# Patient Record
Sex: Female | Born: 1965 | Race: White | Hispanic: No | Marital: Married | Smoking: Former smoker
Health system: Southern US, Community
[De-identification: ages and names within clinical notes are randomized; demographics above are authoritative.]

## PROBLEM LIST (undated history)

## (undated) DIAGNOSIS — M7521 Bicipital tendinitis, right shoulder: Secondary | ICD-10-CM

## (undated) DIAGNOSIS — N200 Calculus of kidney: Secondary | ICD-10-CM

## (undated) DIAGNOSIS — Z87442 Personal history of urinary calculi: Secondary | ICD-10-CM

## (undated) DIAGNOSIS — Z8489 Family history of other specified conditions: Secondary | ICD-10-CM

## (undated) DIAGNOSIS — E785 Hyperlipidemia, unspecified: Secondary | ICD-10-CM

## (undated) DIAGNOSIS — S43431A Superior glenoid labrum lesion of right shoulder, initial encounter: Secondary | ICD-10-CM

## (undated) DIAGNOSIS — D649 Anemia, unspecified: Secondary | ICD-10-CM

## (undated) HISTORY — PX: DILATION AND CURETTAGE OF UTERUS: SHX78

## (undated) HISTORY — PX: TUBAL LIGATION: SHX77

## (undated) HISTORY — PX: BREAST CYST ASPIRATION: SHX578

## (undated) HISTORY — PX: BREAST BIOPSY: SHX20

## (undated) HISTORY — PX: ABLATION: SHX5711

---

## 2004-05-04 ENCOUNTER — Emergency Department: Payer: Self-pay | Admitting: Emergency Medicine

## 2004-10-13 ENCOUNTER — Ambulatory Visit: Payer: Self-pay | Admitting: Unknown Physician Specialty

## 2005-07-17 ENCOUNTER — Emergency Department: Payer: Self-pay | Admitting: General Practice

## 2005-11-08 ENCOUNTER — Ambulatory Visit: Payer: Self-pay | Admitting: Unknown Physician Specialty

## 2006-10-24 ENCOUNTER — Ambulatory Visit: Payer: Self-pay | Admitting: Podiatry

## 2006-11-22 ENCOUNTER — Ambulatory Visit: Payer: Self-pay | Admitting: Unknown Physician Specialty

## 2007-11-28 ENCOUNTER — Ambulatory Visit: Payer: Self-pay | Admitting: Unknown Physician Specialty

## 2008-12-02 ENCOUNTER — Ambulatory Visit: Payer: Self-pay | Admitting: Unknown Physician Specialty

## 2009-12-07 ENCOUNTER — Ambulatory Visit: Payer: Self-pay | Admitting: Unknown Physician Specialty

## 2010-11-18 ENCOUNTER — Emergency Department: Payer: Self-pay | Admitting: Emergency Medicine

## 2011-01-04 ENCOUNTER — Ambulatory Visit: Payer: Self-pay | Admitting: Unknown Physician Specialty

## 2011-08-01 ENCOUNTER — Ambulatory Visit: Payer: Self-pay | Admitting: Obstetrics and Gynecology

## 2011-08-01 LAB — URINALYSIS, COMPLETE
Bacteria: NONE SEEN
Bilirubin,UR: NEGATIVE
Blood: NEGATIVE
Specific Gravity: 1.018 (ref 1.003–1.030)
Squamous Epithelial: 2

## 2011-08-01 LAB — HEMOGLOBIN: HGB: 14.3 g/dL (ref 12.0–16.0)

## 2011-08-07 ENCOUNTER — Ambulatory Visit: Payer: Self-pay | Admitting: Obstetrics and Gynecology

## 2011-08-07 LAB — PREGNANCY, URINE: Pregnancy Test, Urine: NEGATIVE m[IU]/mL

## 2013-07-29 ENCOUNTER — Ambulatory Visit: Payer: Self-pay | Admitting: Physician Assistant

## 2014-05-27 ENCOUNTER — Emergency Department
Admit: 2014-05-27 | Disposition: A | Discharge status: Home / Self Care | Payer: Self-pay | Admitting: Emergency Medicine

## 2014-05-31 NOTE — Op Note (Signed)
PATIENT NAME:  Kaitlin Mullen, Kaitlin Mullen MR#:  [id number] DATE OF BIRTH:  Mar 27, 1965  DATE OF PROCEDURE:  08/07/2011  PREOPERATIVE DIAGNOSES:  1. Polymenorrhea. 2. Menorrhagia.   POSTOPERATIVE DIAGNOSES: 1. Polymenorrhea. 2. Menorrhagia.   PROCEDURES:  1. Hysteroscopy.  2. Endometrial biopsy. 3. Thermal endometrial ablation.   SURGEON: Ricky Mullen. Logan BoresEvans, MD   ANESTHESIA: LMA.   FINDINGS: Grossly normal cavity, minimal curettings. NovaSure measurements showed a cavity length of 4.5 cm, cavity width of 3.3 cm, power was 82. Time was 1 minute and 36 seconds.   DRAINS: None.   ESTIMATED BLOOD LOSS: Minimal.   PROCEDURE IN DETAIL: The patient consented. Preoperative antibiotics given. She was taken to the operating room and placed in the supine position where LMA anesthesia was initiated. She was then placed in the dorsal lithotomy position using candy-cane stirrups, prepped and draped in the usual sterile fashion. Cervix was visualized. Cervix was noted to have an obstetrical laceration, healed, at about 10:30. Cervix was grasped, easily dilated up to permit hysteroscope with hysteroscopy as noted above.   Hysteroscope was removed. NovaSure apparatus was placed, passed cavity test first attempt and cycle was carried out without incident.   Instruments removed. Cervix was made hemostatic with silver nitrate and she was returned to the supine position under the care of anesthesia.   Please note that brief endometrial curetting was used after hysteroscopy for endometrial sampling and sent for permanent specimen.   All instrument, needle, and sponge counts were correct. The patient tolerated the procedure well. I anticipate a routine postoperative course.   ____________________________ Reatha Harpsicky Mullen. Logan BoresEvans, MD rle:drc D: 08/07/2011 09:50:22 ET T: 08/07/2011 10:26:12 ET JOB#: [id number]  cc: Clide Clifficky Mullen. Logan BoresEvans, MD, <Dictator> Augustina MoodICK Mullen Freda Jaquith MD ELECTRONICALLY SIGNED 08/08/2011 14:36

## 2015-07-15 ENCOUNTER — Other Ambulatory Visit: Payer: Self-pay | Admitting: Physician Assistant

## 2015-07-15 DIAGNOSIS — Z1231 Encounter for screening mammogram for malignant neoplasm of breast: Secondary | ICD-10-CM

## 2015-07-28 ENCOUNTER — Other Ambulatory Visit: Payer: Self-pay | Admitting: Physician Assistant

## 2015-07-28 ENCOUNTER — Ambulatory Visit
Admission: RE | Admit: 2015-07-28 | Discharge: 2015-07-28 | Disposition: A | Discharge status: Home / Self Care | Payer: BLUE CROSS/BLUE SHIELD | Source: Ambulatory Visit | Attending: Physician Assistant | Admitting: Physician Assistant

## 2015-07-28 DIAGNOSIS — Z1231 Encounter for screening mammogram for malignant neoplasm of breast: Secondary | ICD-10-CM

## 2015-07-28 DIAGNOSIS — R928 Other abnormal and inconclusive findings on diagnostic imaging of breast: Secondary | ICD-10-CM | POA: Insufficient documentation

## 2015-07-30 ENCOUNTER — Other Ambulatory Visit: Payer: Self-pay | Admitting: Physician Assistant

## 2015-07-30 DIAGNOSIS — R928 Other abnormal and inconclusive findings on diagnostic imaging of breast: Secondary | ICD-10-CM

## 2015-08-12 ENCOUNTER — Ambulatory Visit
Admission: RE | Admit: 2015-08-12 | Discharge: 2015-08-12 | Disposition: A | Discharge status: Home / Self Care | Payer: BLUE CROSS/BLUE SHIELD | Source: Ambulatory Visit | Attending: Physician Assistant | Admitting: Physician Assistant

## 2015-08-12 DIAGNOSIS — R928 Other abnormal and inconclusive findings on diagnostic imaging of breast: Secondary | ICD-10-CM

## 2016-08-30 ENCOUNTER — Other Ambulatory Visit: Payer: Self-pay | Admitting: Physician Assistant

## 2016-08-30 DIAGNOSIS — Z1231 Encounter for screening mammogram for malignant neoplasm of breast: Secondary | ICD-10-CM

## 2017-02-06 IMAGING — MG 2D DIGITAL DIAGNOSTIC UNILATERAL LEFT MAMMOGRAM WITH CAD AND ADJ
6 series · 6 of 14 positions shown · non-contrast
Comparison: 07/28/2015 and earlier priors

CLINICAL DATA: Possible mass in the retroareolar left breast and
possible skin thickening of the periareolar left breast identified
on recent screening mammogram.

EXAM:
2D DIGITAL DIAGNOSTIC LEFT MAMMOGRAM WITH CAD AND ADJUNCT TOMO
ULTRASOUND LEFT BREAST

[L ML synth-2D]
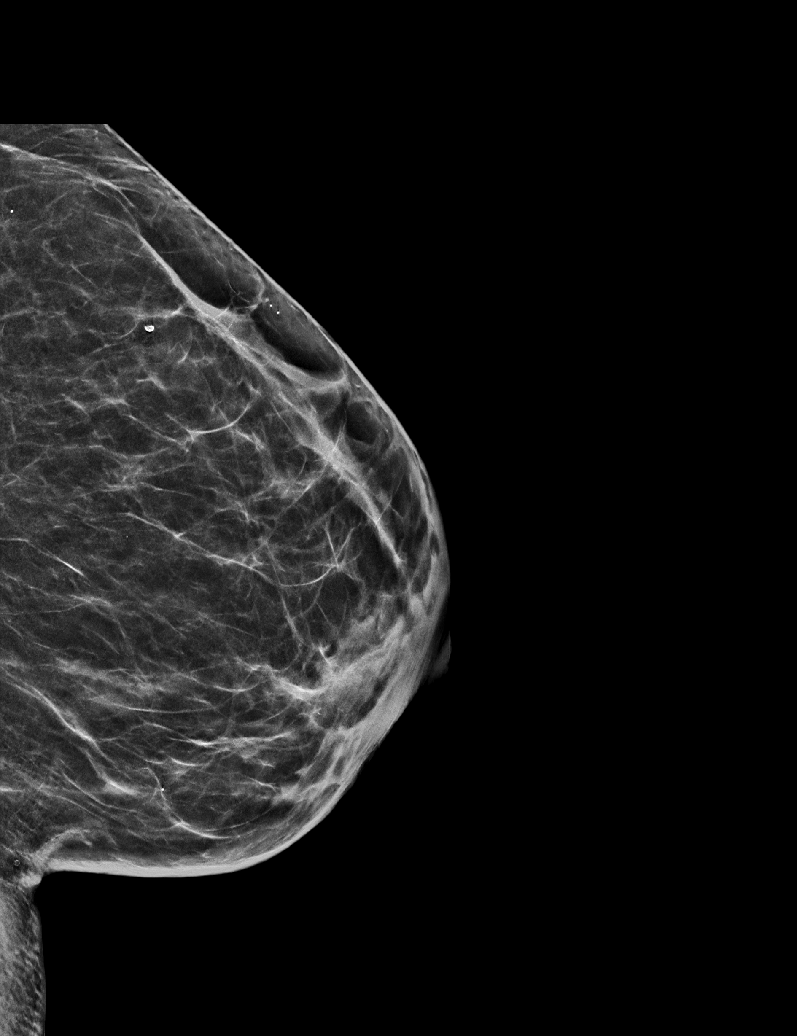

[L MLO]
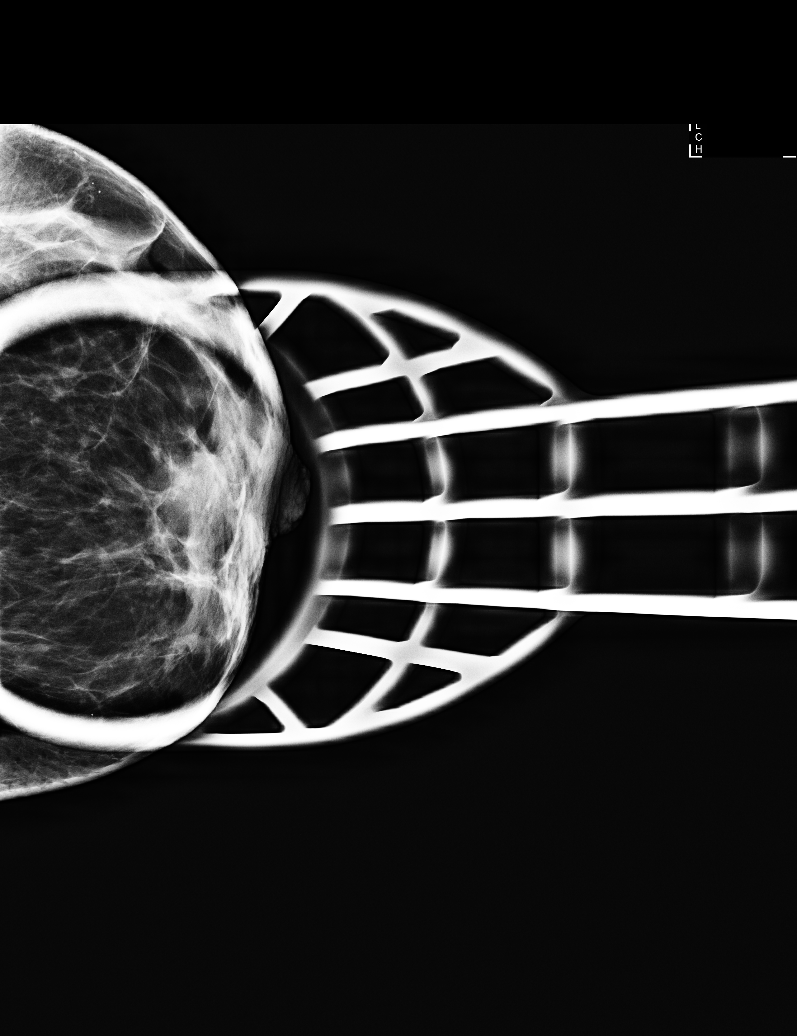

[L MLO synth-2D]
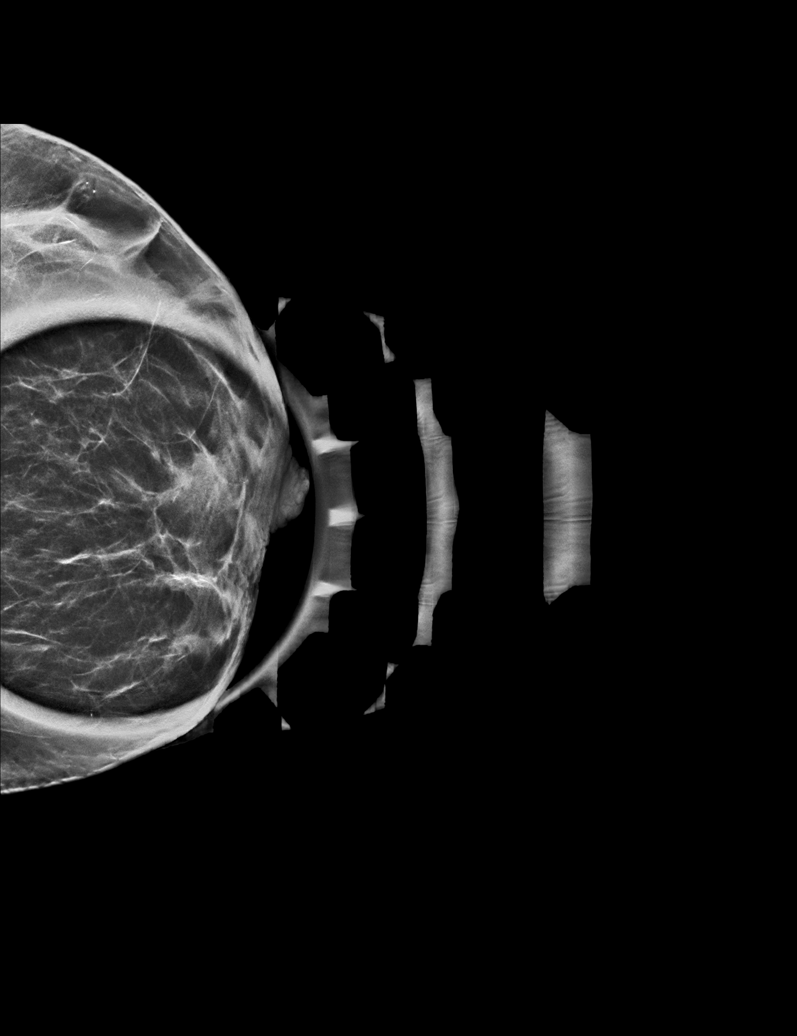

[L ML]
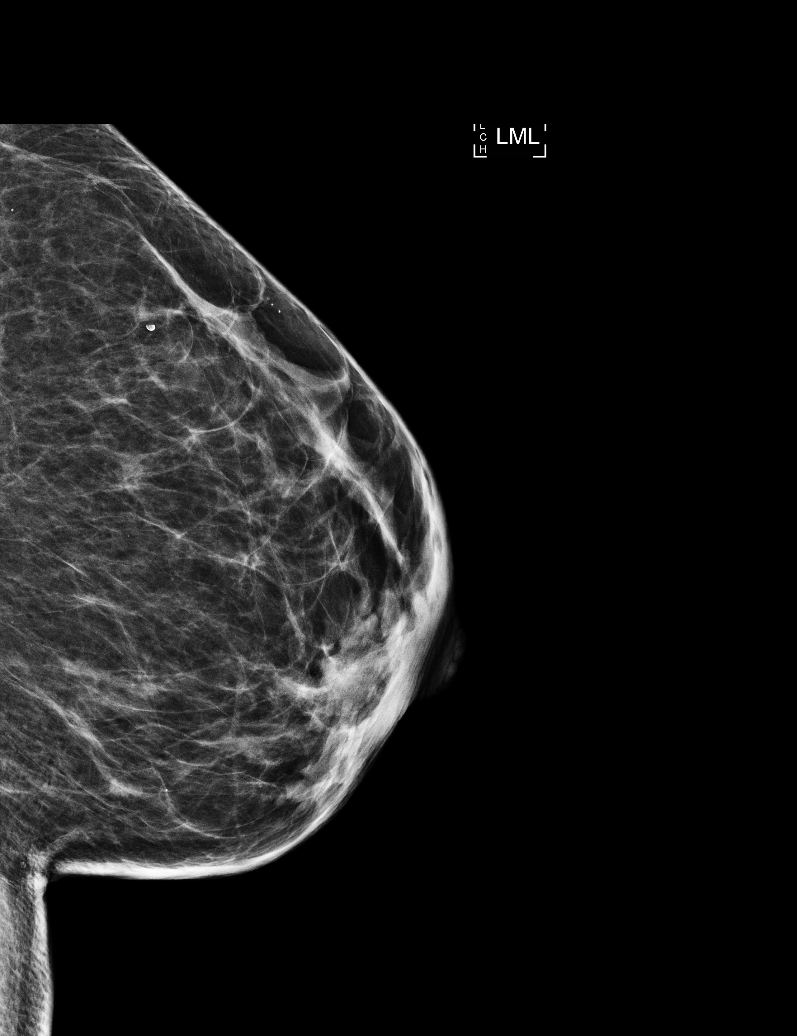

[L ML tomo · tomo slice 27/54.0]
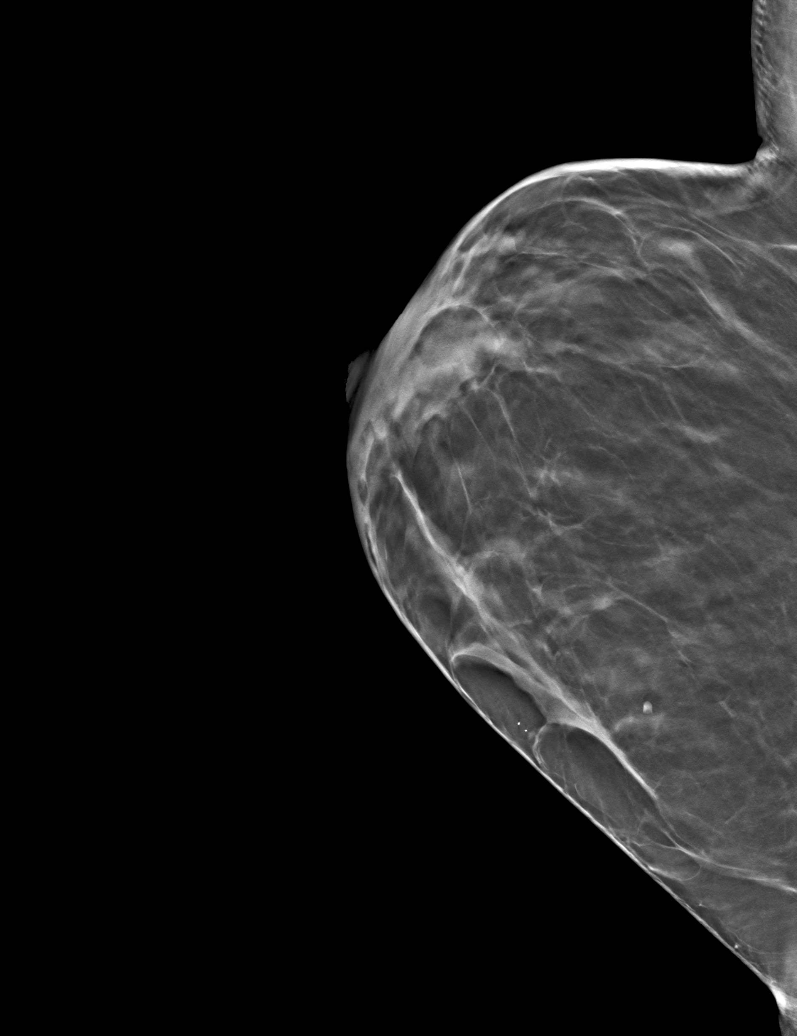

[L MLO tomo · tomo slice 23/46.0]
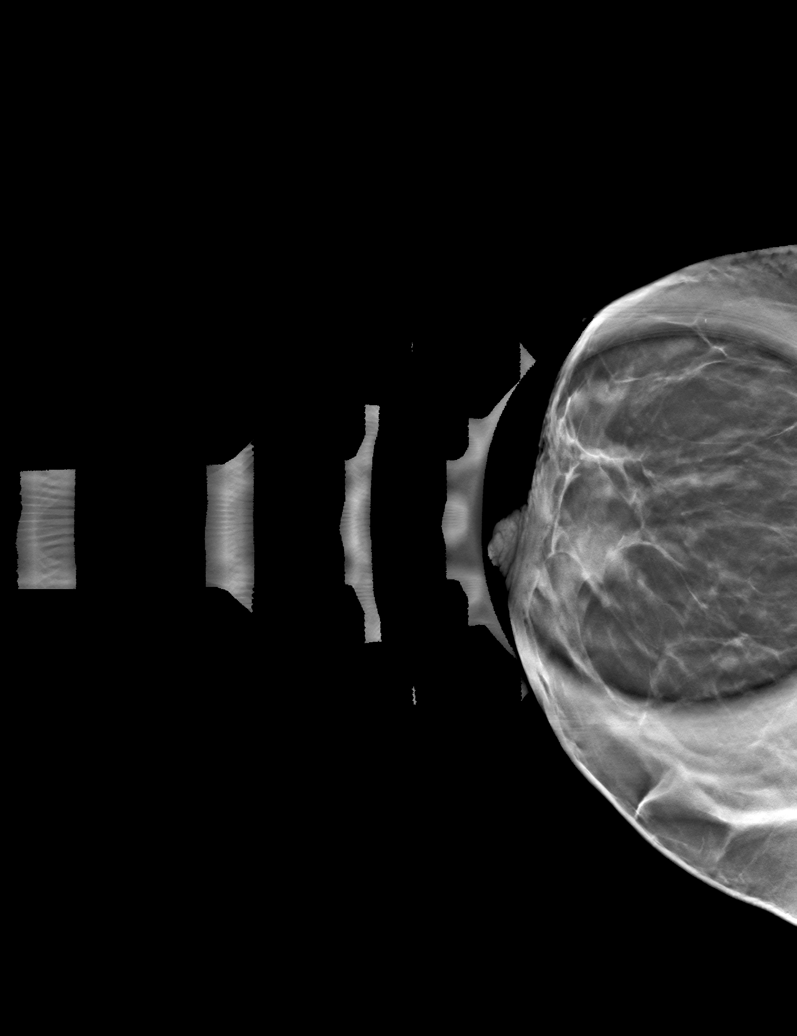

[6 of 14 positions shown; findings below may reference images not displayed]

ACR Breast Density Category c: The breast tissue is heterogeneously
dense, which may obscure small masses.
FINDINGS: Focal spot compression views of the left breast in the MLO
projection, with tomography and 90 degrees lateral images of the
left breast with tomography are submitted. The retroareolar breast
parenchymal pattern appears stable compared to prior mammograms. No
evidence of new definite skin thickening.

Mammographic images were processed with CAD.

On physical exam, the skin of the left breast appears normal. No
retroareolar mass is palpated.

Targeted ultrasound is performed, showing normal skin thickness and
normal retroareolar fibroglandular tissue. No solid or cystic breast
mass or abnormal shadowing is identified.
IMPRESSION: No evidence of malignancy in the left breast.

RECOMMENDATION:
Screening mammogram in one year.(Code:[id number])

I have discussed the findings and recommendations with the patient.
Results were also provided in writing at the conclusion of the
visit. If applicable, a reminder letter will be sent to the patient
regarding the next appointment.

BI-RADS CATEGORY  1: Negative.

## 2017-08-06 ENCOUNTER — Other Ambulatory Visit: Payer: Self-pay | Admitting: Physician Assistant

## 2017-08-06 DIAGNOSIS — R102 Pelvic and perineal pain: Secondary | ICD-10-CM

## 2017-08-08 ENCOUNTER — Ambulatory Visit
Admission: RE | Admit: 2017-08-08 | Discharge: 2017-08-08 | Disposition: A | Discharge status: Home / Self Care | Payer: BLUE CROSS/BLUE SHIELD | Source: Ambulatory Visit | Attending: Physician Assistant | Admitting: Physician Assistant

## 2017-08-08 ENCOUNTER — Encounter (INDEPENDENT_AMBULATORY_CARE_PROVIDER_SITE_OTHER): Payer: Self-pay

## 2017-08-08 DIAGNOSIS — R102 Pelvic and perineal pain: Secondary | ICD-10-CM | POA: Diagnosis not present

## 2017-08-08 DIAGNOSIS — N200 Calculus of kidney: Secondary | ICD-10-CM | POA: Insufficient documentation

## 2017-08-08 MED ORDER — IOPAMIDOL (ISOVUE-300) INJECTION 61%
100.0000 mL | Freq: Once | INTRAVENOUS | Status: AC | PRN
  Administered 2017-08-08: 100 mL via INTRAVENOUS

## 2017-11-08 ENCOUNTER — Other Ambulatory Visit: Payer: Self-pay | Admitting: Physician Assistant

## 2017-11-08 DIAGNOSIS — Z1231 Encounter for screening mammogram for malignant neoplasm of breast: Secondary | ICD-10-CM

## 2017-11-28 ENCOUNTER — Ambulatory Visit
Admission: RE | Admit: 2017-11-28 | Discharge: 2017-11-28 | Disposition: A | Discharge status: Home / Self Care | Payer: BLUE CROSS/BLUE SHIELD | Source: Ambulatory Visit | Attending: Physician Assistant | Admitting: Physician Assistant

## 2017-11-28 DIAGNOSIS — Z1231 Encounter for screening mammogram for malignant neoplasm of breast: Secondary | ICD-10-CM | POA: Diagnosis present

## 2018-10-03 ENCOUNTER — Other Ambulatory Visit: Payer: Self-pay | Admitting: Physician Assistant

## 2018-10-03 DIAGNOSIS — Z1231 Encounter for screening mammogram for malignant neoplasm of breast: Secondary | ICD-10-CM

## 2018-12-17 ENCOUNTER — Ambulatory Visit
Admission: RE | Admit: 2018-12-17 | Discharge: 2018-12-17 | Disposition: A | Discharge status: Home / Self Care | Payer: BC Managed Care – PPO | Source: Ambulatory Visit | Attending: Physician Assistant | Admitting: Physician Assistant

## 2018-12-17 DIAGNOSIS — Z1231 Encounter for screening mammogram for malignant neoplasm of breast: Secondary | ICD-10-CM | POA: Diagnosis not present

## 2019-08-07 ENCOUNTER — Other Ambulatory Visit: Payer: Self-pay | Admitting: Surgery

## 2019-08-07 DIAGNOSIS — M7580 Other shoulder lesions, unspecified shoulder: Secondary | ICD-10-CM

## 2019-08-07 DIAGNOSIS — M7581 Other shoulder lesions, right shoulder: Secondary | ICD-10-CM

## 2019-09-11 ENCOUNTER — Other Ambulatory Visit: Payer: Self-pay | Admitting: Surgery

## 2019-09-16 ENCOUNTER — Other Ambulatory Visit: Payer: Self-pay

## 2019-09-16 ENCOUNTER — Encounter
Admission: RE | Admit: 2019-09-16 | Discharge: 2019-09-16 | Disposition: A | Discharge status: Home / Self Care | Payer: BC Managed Care – PPO | Source: Ambulatory Visit | Attending: Surgery | Admitting: Surgery

## 2019-09-16 HISTORY — DX: Family history of other specified conditions: Z84.89

## 2019-09-16 NOTE — Patient Instructions (Addendum)
Your procedure is scheduled on: 09/23/19 Report to DAY SURGERY DEPARTMENT LOCATED ON 2ND FLOOR ME/DICAL MALL ENTRANCE. /To find out your arrival time please call [phone] between 1PM - 3PM on 09/22/19.  Remember: Instructions that are not followed completely may result in serious medical risk, up to and including death, or upon the discretion of your surgeon and anesthesiologist your surgery may need to be rescheduled.     _X__ 1. Do not eat food after midnight the night before your procedure.                 No gum chewing or hard candies. You may drink clear liquids up to 2 hours                 before you are scheduled to arrive for your surgery- DO not drink clear                 liquids within 2 hours of the start of your surgery.                 Clear Liquids include:  water, apple juice without pulp, clear carbohydrate                 drink such as Clearfast or Gatorade, Black Coffee or Tea (Do not add                 anything to coffee or tea). Diabetics water only  FINISH ENSURE PRE SURGERY (CLEAR) CARB DRINK 2 HOURS PRIOR TO ARRIVING.  __X__2.  On the morning of surgery brush your teeth with toothpaste and water, you                 may rinse your mouth with mouthwash if you wish.  Do not swallow any              toothpaste of mouthwash.     _X__ 3.  No Alcohol for 24 hours before or after surgery.   _X__ 4.  Do Not Smoke or use e-cigarettes For 24 Hours Prior to Your Surgery.                 Do not use any chewable tobacco products for at least 6 hours prior to                 surgery.  ____  5.  Bring all medications with you on the day of surgery if instructed.   __X__  6.  Notify your doctor if there is any change in your medical condition      (cold, fever, infections).     Do not wear jewelry, make-up, hairpins, clips or nail polish. Do not wear lotions, powders, or perfumes.  Do not shave 48 hours prior to surgery. Men may shave face and neck. Do not bring  valuables to the hospital.    [facility] is not responsible for any belongings or valuables.  Contacts, dentures/partials or body piercings may not be worn into surgery. Bring a case for your contacts, glasses or hearing aids, a denture cup will be supplied. Leave your suitcase in the car. After surgery it may be brought to your room. For patients admitted to the hospital, discharge time is determined by your treatment team.   Patients discharged the day of surgery will not be allowed to drive home.   Please read over the following fact sheets that you were given:   MRSA Information  __X__ Take  these medicines the morning of surgery with A SIP OF WATER:    1. NONE  2.   3.   4.  5.  6.  ____ Fleet Enema (as directed)   __X__ Use CHG Soap/SAGE wipes as directed  ____ Use inhalers on the day of surgery  ____ Stop metformin/Janumet/Farxiga 2 days prior to surgery    ____ Take 1/2 of usual insulin dose the night before surgery. No insulin the morning          of surgery.   ____ Stop Blood Thinners Coumadin/Plavix/Xarelto/Pleta/Pradaxa/Eliquis/Effient/Aspirin  on   Or contact your Surgeon, Cardiologist or Medical Doctor regarding  ability to stop your blood thinners  __X__ Stop Anti-inflammatories 7 days before surgery such as Advil, Ibuprofen, Motrin,  BC or Goodies Powder, Naprosyn, Naproxen, Aleve, Aspirin    __X__ Stop all herbal supplements, fish oil or vitamin E until after surgery.    ____ Bring C-Pap to the hospital.     How to Use an Incentive Spirometer An incentive spirometer is a tool that measures how well you are filling your lungs with each breath. Learning to take long, deep breaths using this tool can help you keep your lungs clear and active. This may help to reverse or lessen your chance of developing breathing (pulmonary) problems, especially infection. You may be asked to use a spirometer:  After a surgery.  If you have a lung problem or a history of  smoking.  After a long period of time when you have been unable to move or be active. If the spirometer includes an indicator to show the highest number that you have reached, your health care provider or respiratory therapist will help you set a goal. Keep a list (log) of your progress as told by your health care provider. What are the risks?  Breathing too quickly may cause dizziness or cause you to pass out. Take your time so you do not get dizzy or light-headed.  If you are in pain, you may need to take pain medicine before doing incentive spirometry. It is harder to take a deep breath if you are having pain. How to use your incentive spirometer  1. Sit up on the edge of your bed or on a chair. 2. Hold the incentive spirometer so that it is in an upright position. 3. Before you use the spirometer, breathe out normally. 4. Place the mouthpiece in your mouth. Make sure your lips are closed tightly around it. 5. Breathe in slowly and as deeply as you can through your mouth, causing the piston or the ball to rise toward the top of the chamber. 6. Hold your breath for 3-5 seconds, or for as long as possible. ? If the spirometer includes a coach indicator, use this to guide you in breathing. Slow down your breathing if the indicator goes above the marked areas. 7. Remove the mouthpiece from your mouth and breathe out normally. The piston or ball will return to the bottom of the chamber. 8. Rest for a few seconds, then repeat the steps 10 or more times. ? Take your time and take a few normal breaths between deep breaths so that you do not get dizzy or light-headed. ? Do this every 1-2 hours when you are awake. 9. If the spirometer includes a goal marker to show the highest number you have reached (best effort), use this as a goal to work toward during each repetition. 10. After each set of 10 deep breaths, cough  a few times. This will help to make sure that your lungs are clear. ? If you have an  incision on your chest or abdomen from surgery, place a pillow or a rolled-up towel firmly against the incision when you cough. This can help to reduce pain from coughing. General tips  When you become able to get out of bed, walk around often and continue to cough to help clear your lungs.  Keep using the incentive spirometer until your health care provider says it is okay to stop using it. If you have been in the hospital, you may be told to keep using the spirometer at home. Contact a health care provider if:  You are having difficulty using the spirometer.  You have trouble using the spirometer as often as instructed.  Your pain medicine is not giving enough relief for you to use the spirometer as told.  You have a fever.  You develop shortness of breath. Get help right away if:  You develop a cough with bloody mucus from the lungs (bloody sputum).  You have fluid or blood coming from an incision site after you cough. Summary  An incentive spirometer is a tool that can help you learn to take long, deep breaths to keep your lungs clear and active.  You may be asked to use a spirometer after a surgery, if you have a lung problem or a history of smoking, or if you have been inactive for a long period of time.  Use your incentive spirometer as instructed every 1-2 hours while you are awake.  If you have an incision on your chest or abdomen, place a pillow or a rolled-up towel firmly against your incision when you cough. This will help to reduce pain. This information is not intended to replace advice given to you by your health care provider. Make sure you discuss any questions you have with your health care provider. Document Revised: 08/23/2018 Document Reviewed: 12/06/2016 Elsevier Patient Education  2020 [employer].

## 2019-09-19 ENCOUNTER — Other Ambulatory Visit: Payer: Self-pay

## 2019-09-19 ENCOUNTER — Other Ambulatory Visit
Admission: RE | Admit: 2019-09-19 | Discharge: 2019-09-19 | Disposition: A | Discharge status: Home / Self Care | Payer: BC Managed Care – PPO | Source: Ambulatory Visit | Attending: Surgery | Admitting: Surgery

## 2019-09-19 DIAGNOSIS — Z20822 Contact with and (suspected) exposure to covid-19: Secondary | ICD-10-CM | POA: Diagnosis not present

## 2019-09-19 DIAGNOSIS — Z01812 Encounter for preprocedural laboratory examination: Secondary | ICD-10-CM | POA: Insufficient documentation

## 2019-09-19 LAB — SARS CORONAVIRUS 2 (TAT 6-24 HRS): SARS Coronavirus 2: NEGATIVE

## 2019-09-23 ENCOUNTER — Ambulatory Visit: Payer: BC Managed Care – PPO | Admitting: Certified Registered Nurse Anesthetist

## 2019-09-23 ENCOUNTER — Encounter
Admission: RE | Disposition: A | Discharge status: Home / Self Care | Payer: Self-pay | Source: Home / Self Care | Attending: Surgery

## 2019-09-23 ENCOUNTER — Encounter: Payer: Self-pay | Admitting: Surgery

## 2019-09-23 ENCOUNTER — Other Ambulatory Visit: Payer: Self-pay

## 2019-09-23 ENCOUNTER — Ambulatory Visit
Admission: RE | Admit: 2019-09-23 | Discharge: 2019-09-23 | Disposition: A | Discharge status: Home / Self Care | Payer: BC Managed Care – PPO | Attending: Surgery | Admitting: Surgery

## 2019-09-23 ENCOUNTER — Ambulatory Visit: Payer: BC Managed Care – PPO

## 2019-09-23 DIAGNOSIS — S43431A Superior glenoid labrum lesion of right shoulder, initial encounter: Secondary | ICD-10-CM | POA: Insufficient documentation

## 2019-09-23 DIAGNOSIS — M75111 Incomplete rotator cuff tear or rupture of right shoulder, not specified as traumatic: Secondary | ICD-10-CM | POA: Diagnosis present

## 2019-09-23 DIAGNOSIS — M7521 Bicipital tendinitis, right shoulder: Secondary | ICD-10-CM | POA: Insufficient documentation

## 2019-09-23 DIAGNOSIS — Z87891 Personal history of nicotine dependence: Secondary | ICD-10-CM | POA: Insufficient documentation

## 2019-09-23 DIAGNOSIS — X58XXXA Exposure to other specified factors, initial encounter: Secondary | ICD-10-CM | POA: Insufficient documentation

## 2019-09-23 DIAGNOSIS — E785 Hyperlipidemia, unspecified: Secondary | ICD-10-CM | POA: Insufficient documentation

## 2019-09-23 DIAGNOSIS — Z419 Encounter for procedure for purposes other than remedying health state, unspecified: Secondary | ICD-10-CM

## 2019-09-23 HISTORY — PX: SHOULDER ARTHROSCOPY WITH SUBACROMIAL DECOMPRESSION AND OPEN ROTATOR C: SHX5688

## 2019-09-23 SURGERY — SHOULDER ARTHROSCOPY WITH SUBACROMIAL DECOMPRESSION AND OPEN ROTATOR CUFF REPAIR, OPEN BICEPS TENDON REPAIR
Anesthesia: General | Site: Shoulder | Laterality: Right

## 2019-09-23 MED ORDER — BUPIVACAINE-EPINEPHRINE 0.5% -1:200000 IJ SOLN
INTRAMUSCULAR | Status: DC | PRN
  Administered 2019-09-23: 30 mL

## 2019-09-23 MED ORDER — OXYCODONE HCL 5 MG PO TABS
ORAL_TABLET | ORAL | Status: AC
  Filled 2019-09-23: qty 1

## 2019-09-23 MED ORDER — PHENYLEPHRINE HCL (PRESSORS) 10 MG/ML IV SOLN
INTRAVENOUS | Status: DC | PRN
  Administered 2019-09-23: 100 ug via INTRAVENOUS

## 2019-09-23 MED ORDER — FENTANYL CITRATE (PF) 100 MCG/2ML IJ SOLN
INTRAMUSCULAR | Status: DC | PRN
  Administered 2019-09-23 (×2): 50 ug via INTRAVENOUS

## 2019-09-23 MED ORDER — METOCLOPRAMIDE HCL 5 MG/ML IJ SOLN: 5.0000 mg | Freq: Three times a day (TID) | INTRAMUSCULAR | Status: DC | PRN

## 2019-09-23 MED ORDER — OXYCODONE HCL 5 MG PO TABS: 5.0000 mg | ORAL_TABLET | ORAL | 0 refills | Status: AC | PRN

## 2019-09-23 MED ORDER — ONDANSETRON HCL 4 MG/2ML IJ SOLN: 4.0000 mg | Freq: Four times a day (QID) | INTRAMUSCULAR | Status: DC | PRN

## 2019-09-23 MED ORDER — OXYCODONE HCL 5 MG PO TABS
5.0000 mg | ORAL_TABLET | ORAL | Status: DC | PRN
  Administered 2019-09-23: 5 mg via ORAL
  Filled 2019-09-23 (×2): qty 2

## 2019-09-23 MED ORDER — PHENYLEPHRINE HCL-NACL 10-0.9 MG/250ML-% IV SOLN
INTRAVENOUS | Status: DC | PRN
  Administered 2019-09-23: 25 ug/min via INTRAVENOUS

## 2019-09-23 MED ORDER — FENTANYL CITRATE (PF) 100 MCG/2ML IJ SOLN: 25.0000 ug | Freq: Once | INTRAMUSCULAR | Status: AC

## 2019-09-23 MED ORDER — ACETAMINOPHEN 10 MG/ML IV SOLN
INTRAVENOUS | Status: AC
  Filled 2019-09-23: qty 100

## 2019-09-23 MED ORDER — LIDOCAINE HCL (CARDIAC) PF 100 MG/5ML IV SOSY
PREFILLED_SYRINGE | INTRAVENOUS | Status: DC | PRN
  Administered 2019-09-23: 80 mg via INTRAVENOUS

## 2019-09-23 MED ORDER — BUPIVACAINE LIPOSOME 1.3 % IJ SUSP
INTRAMUSCULAR | Status: DC | PRN
  Administered 2019-09-23: 20 mL via PERINEURAL

## 2019-09-23 MED ORDER — MIDAZOLAM HCL 2 MG/2ML IJ SOLN: 1.0000 mg | Freq: Once | INTRAMUSCULAR | Status: DC

## 2019-09-23 MED ORDER — DEXAMETHASONE SODIUM PHOSPHATE 10 MG/ML IJ SOLN
INTRAMUSCULAR | Status: DC | PRN
  Administered 2019-09-23: 8 mg via INTRAVENOUS

## 2019-09-23 MED ORDER — LACTATED RINGERS IV SOLN: INTRAVENOUS | Status: DC

## 2019-09-23 MED ORDER — ACETAMINOPHEN 10 MG/ML IV SOLN
INTRAVENOUS | Status: DC | PRN
  Administered 2019-09-23: 1000 mg via INTRAVENOUS

## 2019-09-23 MED ORDER — GLYCOPYRROLATE 0.2 MG/ML IJ SOLN
INTRAMUSCULAR | Status: DC | PRN
  Administered 2019-09-23: .2 mg via INTRAVENOUS

## 2019-09-23 MED ORDER — LIDOCAINE HCL (PF) 1 % IJ SOLN
INTRAMUSCULAR | Status: DC | PRN
  Administered 2019-09-23: 4 mL via SUBCUTANEOUS

## 2019-09-23 MED ORDER — MIDAZOLAM HCL 2 MG/2ML IJ SOLN
INTRAMUSCULAR | Status: AC
  Filled 2019-09-23: qty 2

## 2019-09-23 MED ORDER — FENTANYL CITRATE (PF) 100 MCG/2ML IJ SOLN
INTRAMUSCULAR | Status: AC
  Filled 2019-09-23: qty 2

## 2019-09-23 MED ORDER — BUPIVACAINE HCL (PF) 0.5 % IJ SOLN
INTRAMUSCULAR | Status: AC
  Filled 2019-09-23: qty 10

## 2019-09-23 MED ORDER — LIDOCAINE HCL (PF) 1 % IJ SOLN
INTRAMUSCULAR | Status: AC
  Filled 2019-09-23: qty 5

## 2019-09-23 MED ORDER — SUGAMMADEX SODIUM 200 MG/2ML IV SOLN
INTRAVENOUS | Status: DC | PRN
  Administered 2019-09-23: 200 mg via INTRAVENOUS

## 2019-09-23 MED ORDER — MIDAZOLAM HCL 2 MG/2ML IJ SOLN: 2.0000 mg | Freq: Once | INTRAMUSCULAR | Status: AC

## 2019-09-23 MED ORDER — ROCURONIUM BROMIDE 100 MG/10ML IV SOLN
INTRAVENOUS | Status: DC | PRN
  Administered 2019-09-23: 50 mg via INTRAVENOUS

## 2019-09-23 MED ORDER — CEFAZOLIN SODIUM-DEXTROSE 2-4 GM/100ML-% IV SOLN
2.0000 g | INTRAVENOUS | Status: AC
  Administered 2019-09-23: 2 g via INTRAVENOUS

## 2019-09-23 MED ORDER — PROPOFOL 10 MG/ML IV BOLUS
INTRAVENOUS | Status: DC | PRN
  Administered 2019-09-23: 170 mg via INTRAVENOUS

## 2019-09-23 MED ORDER — POTASSIUM CHLORIDE IN NACL 20-0.9 MEQ/L-% IV SOLN
INTRAVENOUS | Status: DC
  Filled 2019-09-23: qty 1000

## 2019-09-23 MED ORDER — FENTANYL CITRATE (PF) 100 MCG/2ML IJ SOLN
25.0000 ug | INTRAMUSCULAR | Status: DC | PRN
  Administered 2019-09-23: 25 ug via INTRAVENOUS

## 2019-09-23 MED ORDER — FAMOTIDINE 20 MG PO TABS
20.0000 mg | ORAL_TABLET | Freq: Once | ORAL | Status: AC
  Administered 2019-09-23: 20 mg via ORAL

## 2019-09-23 MED ORDER — MIDAZOLAM HCL 2 MG/2ML IJ SOLN
INTRAMUSCULAR | Status: AC
  Administered 2019-09-23: 2 mg via INTRAVENOUS
  Filled 2019-09-23: qty 2

## 2019-09-23 MED ORDER — CEFAZOLIN SODIUM-DEXTROSE 2-4 GM/100ML-% IV SOLN
INTRAVENOUS | Status: AC
  Filled 2019-09-23: qty 100

## 2019-09-23 MED ORDER — ONDANSETRON HCL 4 MG/2ML IJ SOLN
INTRAMUSCULAR | Status: AC
  Filled 2019-09-23: qty 2

## 2019-09-23 MED ORDER — FENTANYL CITRATE (PF) 100 MCG/2ML IJ SOLN
INTRAMUSCULAR | Status: AC
  Administered 2019-09-23: 25 ug via INTRAVENOUS
  Filled 2019-09-23: qty 2

## 2019-09-23 MED ORDER — ONDANSETRON HCL 4 MG/2ML IJ SOLN
INTRAMUSCULAR | Status: DC | PRN
  Administered 2019-09-23: 4 mg via INTRAVENOUS

## 2019-09-23 MED ORDER — ONDANSETRON HCL 4 MG PO TABS: 4.0000 mg | ORAL_TABLET | Freq: Four times a day (QID) | ORAL | Status: DC | PRN

## 2019-09-23 MED ORDER — CHLORHEXIDINE GLUCONATE 0.12 % MT SOLN: 15.0000 mL | Freq: Once | OROMUCOSAL | Status: AC

## 2019-09-23 MED ORDER — ORAL CARE MOUTH RINSE: 15.0000 mL | Freq: Once | OROMUCOSAL | Status: AC

## 2019-09-23 MED ORDER — FAMOTIDINE 20 MG PO TABS
ORAL_TABLET | ORAL | Status: AC
  Filled 2019-09-23: qty 1

## 2019-09-23 MED ORDER — ONDANSETRON HCL 4 MG/2ML IJ SOLN: 4.0000 mg | Freq: Once | INTRAMUSCULAR | Status: DC | PRN

## 2019-09-23 MED ORDER — BUPIVACAINE LIPOSOME 1.3 % IJ SUSP
INTRAMUSCULAR | Status: AC
  Filled 2019-09-23: qty 20

## 2019-09-23 MED ORDER — CHLORHEXIDINE GLUCONATE 0.12 % MT SOLN
OROMUCOSAL | Status: AC
  Administered 2019-09-23: 15 mL via OROMUCOSAL
  Filled 2019-09-23: qty 15

## 2019-09-23 MED ORDER — BUPIVACAINE HCL (PF) 0.5 % IJ SOLN
INTRAMUSCULAR | Status: DC | PRN
Start: 2019-09-23 — End: 2019-09-23
  Administered 2019-09-23: 10 mL via PERINEURAL

## 2019-09-23 MED ORDER — METOCLOPRAMIDE HCL 10 MG PO TABS: 5.0000 mg | ORAL_TABLET | Freq: Three times a day (TID) | ORAL | Status: DC | PRN

## 2019-09-23 MED ORDER — MIDAZOLAM HCL 2 MG/2ML IJ SOLN
INTRAMUSCULAR | Status: DC | PRN
  Administered 2019-09-23: 2 mg via INTRAVENOUS

## 2019-09-23 MED ORDER — FENTANYL CITRATE (PF) 100 MCG/2ML IJ SOLN: 50.0000 ug | Freq: Once | INTRAMUSCULAR | Status: DC

## 2019-09-23 SURGICAL SUPPLY — 48 items
ANCH SUT 2 2.9 2 LD TPR NDL (Anchor) ×1 IMPLANT
ANCHOR JUGGERKNOT WTAP NDL 2.9 (Anchor) ×3 IMPLANT
APL PRP STRL LF DISP 70% ISPRP (MISCELLANEOUS) ×1
BIT DRILL JUGRKNT W/NDL BIT2.9 (DRILL) ×2 IMPLANT
BLADE FULL RADIUS 3.5 (BLADE) ×3 IMPLANT
BUR ACROMIONIZER 4.0 (BURR) ×3 IMPLANT
CANNULA SHAVER 8MMX76MM (CANNULA) ×3 IMPLANT
CHLORAPREP W/TINT 26 (MISCELLANEOUS) ×3 IMPLANT
COVER MAYO STAND REUSABLE (DRAPES) ×3 IMPLANT
COVER WAND RF STERILE (DRAPES) ×3 IMPLANT
DRAPE IMP U-DRAPE 54X76 (DRAPES) ×6 IMPLANT
DRILL JUGGERKNOT W/NDL BIT 2.9 (DRILL) ×6
ELECT CAUTERY BLADE TIP 2.5 (TIP) ×3
ELECT REM PT RETURN 9FT ADLT (ELECTROSURGICAL) ×3
ELECTRODE CAUTERY BLDE TIP 2.5 (TIP) ×1 IMPLANT
ELECTRODE REM PT RTRN 9FT ADLT (ELECTROSURGICAL) ×1 IMPLANT
GAUZE SPONGE 4X4 12PLY STRL (GAUZE/BANDAGES/DRESSINGS) ×3 IMPLANT
GAUZE XEROFORM 1X8 LF (GAUZE/BANDAGES/DRESSINGS) ×3 IMPLANT
GLOVE BIO SURGEON STRL SZ7.5 (GLOVE) ×6 IMPLANT
GLOVE BIO SURGEON STRL SZ8 (GLOVE) ×6 IMPLANT
GLOVE BIOGEL PI IND STRL 8 (GLOVE) ×1 IMPLANT
GLOVE BIOGEL PI INDICATOR 8 (GLOVE) ×2
GLOVE INDICATOR 8.0 STRL GRN (GLOVE) ×3 IMPLANT
GOWN STRL REUS W/ TWL LRG LVL3 (GOWN DISPOSABLE) ×1 IMPLANT
GOWN STRL REUS W/ TWL XL LVL3 (GOWN DISPOSABLE) ×1 IMPLANT
GOWN STRL REUS W/TWL LRG LVL3 (GOWN DISPOSABLE) ×3
GOWN STRL REUS W/TWL XL LVL3 (GOWN DISPOSABLE) ×3
GRASPER SUT 15 45D LOW PRO (SUTURE) IMPLANT
IV LACTATED RINGER IRRG 3000ML (IV SOLUTION) ×6
IV LR IRRIG 3000ML ARTHROMATIC (IV SOLUTION) ×2 IMPLANT
KIT CANNULA 8X76-LX IN CANNULA (CANNULA) IMPLANT
MANIFOLD NEPTUNE II (INSTRUMENTS) ×3 IMPLANT
MASK FACE SPIDER DISP (MASK) ×3 IMPLANT
MAT ABSORB  FLUID 56X50 GRAY (MISCELLANEOUS) ×2
MAT ABSORB FLUID 56X50 GRAY (MISCELLANEOUS) ×1 IMPLANT
PACK SHDR ARTHRO (MISCELLANEOUS) ×3 IMPLANT
PASSER SUT FIRSTPASS SELF (INSTRUMENTS) IMPLANT
SLING ARM LRG DEEP (SOFTGOODS) ×3 IMPLANT
SLING ULTRA II LG (MISCELLANEOUS) ×3 IMPLANT
STAPLER SKIN PROX 35W (STAPLE) ×3 IMPLANT
STRAP SAFETY 5IN WIDE (MISCELLANEOUS) ×3 IMPLANT
SUT ETHIBOND 0 MO6 C/R (SUTURE) ×3 IMPLANT
SUT ULTRABRAID 2 COBRAID 38 (SUTURE) IMPLANT
SUT VIC AB 2-0 CT1 27 (SUTURE) ×6
SUT VIC AB 2-0 CT1 TAPERPNT 27 (SUTURE) ×2 IMPLANT
TAPE MICROFOAM 4IN (TAPE) ×3 IMPLANT
TUBING ARTHRO INFLOW-ONLY STRL (TUBING) ×3 IMPLANT
WAND WEREWOLF FLOW 90D (MISCELLANEOUS) ×3 IMPLANT

## 2019-09-23 NOTE — Op Note (Signed)
09/23/2019  3:43 PM  Patient:   Kaitlin Mullen  Pre-Op Diagnosis:   Impingement/tendinopathy with possible partial-thickness rotator cuff tear and biceps tendinitis, right shoulder.  Post-Op Diagnosis:   Impingement/tendinopathy with Type I labral tear and biceps tendinopathy, right shoulder.  Procedure:   Limited arthroscopic debridement, arthroscopic subacromial decompression, and mini-open biceps tenodesis, right shoulder.  Anesthesia:   General endotracheal with interscalene block using Exparel placed preoperatively by the anesthesiologist.  Surgeon:   Maryagnes Amos, MD  Assistant:   Horris Latino, PA-C; Volanda Napoleon, PA-S  Findings:   As above. There was moderate fraying of the superior and posterior superior portions of the labrum without frank detachment from the glenoid. There was mild focal fraying of the articular side of the anterior insertional fibers of the supraspinatus without compromise of the footprint. The remainder the rotator cuff was in excellent condition. There was moderate "lip sticking" of the biceps tendon without frank partial or full-thickness tearing. The articular surfaces of the glenoid and humerus both were in satisfactory condition.  Complications:   None  Fluids:   800 cc  Estimated blood loss:   5 cc  Tourniquet time:   None  Drains:   None  Closure:   Staples      Brief clinical note:   The patient is a 54 year old female with a history of right shoulder pain. The patient's symptoms have progressed despite medications, activity modification, etc. The patient's history and examination are consistent with impingement/tendinopathy with a rotator cuff tear. These findings were confirmed by MRI scan. The patient presents at this time for definitive management of these shoulder symptoms.  Procedure:   The patient underwent placement of an interscalene block using Exparel by the anesthesiologist in the preoperative holding area before being  brought into the operating room and lain in the supine position. The patient then underwent general endotracheal intubation and anesthesia before being repositioned in the beach chair position using the beach chair positioner. The right shoulder and upper extremity were prepped with ChloraPrep solution before being draped sterilely. Preoperative antibiotics were administered. A timeout was performed to confirm the proper surgical site before the expected portal sites and incision site were injected with 0.5% Sensorcaine with epinephrine.   A posterior portal was created and the glenohumeral joint thoroughly inspected with the findings as described above. An anterior portal was created using an outside-in technique. The labrum and rotator cuff were further probed, again confirming the above-noted findings.  The areas of labral fraying were debrided back to stable margins using the full-radius resector. The full-radius resector also was used to debride the frayed portions of the supraspinatus insertional fibers as well this areas of reactive synovitis anteriorly. The ArthroCare wand was inserted and used to release the biceps tendon from its labral anchor. It also was used to obtain hemostasis as well as to "anneal" the labrum superiorly and anteriorly. The instruments were removed from the joint after suctioning the excess fluid.  The camera was repositioned through the posterior portal into the subacromial space. A separate lateral portal was created using an outside-in technique. The 3.5 mm full-radius resector was introduced and used to perform a subtotal bursectomy. The ArthroCare wand was then inserted and used to remove the periosteal tissue off the undersurface of the anterior third of the acromion as well as to recess the coracoacromial ligament from its attachment along the anterior and lateral margins of the acromion. The 4.0 mm acromionizing bur was introduced and used to complete  the decompression by  removing the undersurface of the anterior third of the acromion. The full radius resector was reintroduced to remove any residual bony debris before the ArthroCare wand was reintroduced to obtain hemostasis. The instruments were then removed from the subacromial space after suctioning the excess fluid.  An approximately 4-5 cm incision was made over the anterolateral aspect of the shoulder beginning at the anterolateral corner of the acromion and extending distally in line with the bicipital groove. This incision was carried down through the subcutaneous tissues to expose the deltoid fascia. The raphae between the anterior and middle thirds was identified and this plane developed to provide access into the subacromial space. Additional bursal tissues were debrided sharply using Metzenbaum scissors. The rotator cuff was readily identified and carefully inspected both visually and by palpation.  No evidence for any partial or full-thickness tears was identified.   The bicipital groove was identified by palpation and opened for 1-1.5 cm. The biceps tendon stump was retrieved through this defect. The floor of the bicipital groove was roughened with a curet before a single Biomet 2.9 mm JuggerKnot anchor was inserted. Both sets of sutures were passed through the biceps tendon and tied securely to effect the tenodesis. The bicipital sheath was reapproximated using two #0 Ethibond interrupted sutures, incorporating the biceps tendon to further reinforce the tenodesis.  The wound was copiously irrigated with sterile saline solution before the deltoid raphae was reapproximated using 2-0 Vicryl interrupted sutures. The subcutaneous tissues were closed in two layers using 2-0 Vicryl interrupted sutures before the skin was closed using staples. The portal sites also were closed using staples. A sterile bulky dressing was applied to the shoulder before the arm was placed into a shoulder immobilizer. The patient was then  awakened, extubated, and returned to the recovery room in satisfactory condition after tolerating the procedure well.

## 2019-09-23 NOTE — Anesthesia Procedure Notes (Signed)
Anesthesia Regional Block: Interscalene brachial plexus block   Pre-Anesthetic Checklist: ,, timeout performed, Correct Patient, Correct Site, Correct Laterality, Correct Procedure, Correct Position, site marked, Risks and benefits discussed,  Surgical consent,  Pre-op evaluation,  At surgeon's request and post-op pain management  Laterality: Right  Prep: chloraprep       Needles:  Injection technique: Single-shot  Needle Type: Echogenic Stimulator Needle     Needle Length: 10cm  Needle Gauge: 20     Additional Needles:   Procedures:, nerve stimulator,,, ultrasound used (permanent image in chart),,,,   Nerve Stimulator or Paresthesia:  Response: biceps flexion,   Additional Responses:   Narrative:  Start time: 09/23/2019 1:08 PM End time: 09/23/2019 1:17 PM Injection made incrementally with aspirations every 5 mL.  Performed by: Personally  Anesthesiologist: Yevette Edwards, MD  Additional Notes: Functioning IV was confirmed and monitors were applied.Sterile prep and drape,hand hygiene and sterile gloves were used.  Negative aspiration and negative test dose prior to incremental administration of local anesthetic. The patient tolerated the procedure well.

## 2019-09-23 NOTE — Anesthesia Procedure Notes (Signed)
Procedure Name: Intubation Date/Time: 09/23/2019 2:21 PM Performed by: Lowry Bowl, CRNA Pre-anesthesia Checklist: Patient identified, Emergency Drugs available, Suction available and Patient being monitored Patient Re-evaluated:Patient Re-evaluated prior to induction Oxygen Delivery Method: Circle system utilized Preoxygenation: Pre-oxygenation with 100% oxygen Induction Type: IV induction, Cricoid Pressure applied and Rapid sequence Ventilation: Mask ventilation without difficulty Laryngoscope Size: Mac and 3 Grade View: Grade II Tube type: Oral Tube size: 7.0 mm Number of attempts: 1 Airway Equipment and Method: Stylet Placement Confirmation: ETT inserted through vocal cords under direct vision,  positive ETCO2 and breath sounds checked- equal and bilateral Secured at: 21 cm Tube secured with: Tape Dental Injury: Teeth and Oropharynx as per pre-operative assessment

## 2019-09-23 NOTE — Discharge Instructions (Addendum)
AMBULATORY SURGERY  DISCHARGE INSTRUCTIONS   1) The drugs that you were given will stay in your system until tomorrow so for the next 24 hours you should not:  A) Drive an automobile B) Make any legal decisions C) Drink any alcoholic beverage   2) You may resume regular meals tomorrow.  Today it is better to start with liquids and gradually work up to solid foods.  You may eat anything you prefer, but it is better to start with liquids, then soup and crackers, and gradually work up to solid foods.   3) Please notify your doctor immediately if you have any unusual bleeding, trouble breathing, redness and pain at the surgery site, drainage, fever, or pain not relieved by medication.    4) Additional Instructions:        Please contact your physician with any problems or Same Day Surgery at [phone], Monday through Friday 6 am to 4 pm, or Okay at [facility] number at [phone].Orthopedic discharge instructions: Keep dressing dry and intact.  May shower after dressing changed on post-op day #4 (Saturday).  Cover staples with Band-Aids after drying off. Apply ice frequently to shoulder. Take ibuprofen 600-800 mg TID with meals for 7-10 days, then as necessary. Take oxycodone as prescribed when needed.  May supplement with ES Tylenol if necessary. Keep shoulder immobilizer on at all times except may remove for bathing purposes. Follow-up in 10-14 days or as scheduled.      Interscalene Nerve Block with Exparel  1.  For your surgery you have received an Interscalene Nerve Block with Exparel. 2. Nerve Blocks affect many types of nerves, including nerves that control movement, pain and normal sensation.  You may experience feelings such as numbness, tingling, heaviness, weakness or the inability to move your arm or the feeling or sensation that your arm has "fallen asleep". 3. A nerve block with Exparel can last up to 5 days.  Usually the weakness wears off first.   The tingling and heaviness usually wear off next.  Finally you may start to notice pain.  Keep in mind that this may occur in any order.  Once a nerve block starts to wear off it is usually completely gone within 60 minutes. 4. ISNB may cause mild shortness of breath, a hoarse voice, blurry vision, unequal pupils, or drooping of the face on the same side as the nerve block.  These symptoms will usually resolve with the numbness.  Very rarely the procedure itself can cause mild seizures. 5. If needed, your surgeon will give you a prescription for pain medication.  It will take about 60 minutes for the oral pain medication to become fully effective.  So, it is recommended that you start taking this medication before the nerve block first begins to wear off, or when you first begin to feel discomfort. 6. Take your pain medication only as prescribed.  Pain medication can cause sedation and decrease your breathing if you take more than you need for the level of pain that you have. 7. Nausea is a common side effect of many pain medications.  You may want to eat something before taking your pain medicine to prevent nausea. 8. After an Interscalene nerve block, you cannot feel pain, pressure or extremes in temperature in the effected arm.  Because your arm is numb it is at an increased risk for injury.  To decrease the possibility of injury, please practice the following:  a. While you are awake change the position  of your arm frequently to prevent too much pressure on any one area for prolonged periods of time. b.  If you have a cast or tight dressing, check the color or your fingers every couple of hours.  Call your surgeon with the appearance of any discoloration (white or blue). c. If you are given a sling to wear before you go home, please wear it  at all times until the block has completely worn off.  Do not get up at night without your sling. d. Please contact [facility] or your surgeon if you do not  begin to regain sensation after 7 days from the surgery.  Anesthesia may be contacted by calling the Same Day Surgery Department, Mon. through Fri., 6 am to 4 pm at [phone].   e. If you experience any other problems or concerns, please contact your surgeon's office. f. If you experience severe or prolonged shortness of breath go to the nearest emergency department.

## 2019-09-23 NOTE — Transfer of Care (Signed)
Immediate Anesthesia Transfer of Care Note  Patient: Kaitlin Mullen  Procedure(s) Performed: RIGHT SHOULDER ARTHROSCOPY WITH DEBRIDEMENT, DECOMPRESSION, POSSIBEL ROTATOR CUFF REPAIR, AND POSSIBLE BICEPS TENODESIS. (Right Shoulder)  Patient Location: PACU  Anesthesia Type:GA combined with regional for post-op pain  Level of Consciousness: awake, oriented, drowsy and patient cooperative  Airway & Oxygen Therapy: Patient Spontanous Breathing and Patient connected to face mask oxygen  Post-op Assessment: Report given to RN and Post -op Vital signs reviewed and stable  Post vital signs: Reviewed and stable  Last Vitals:  Vitals Value Taken Time  BP 132/74 09/23/19 1554  Temp 36.1 C 09/23/19 1552  Pulse 77 09/23/19 1603  Resp 17 09/23/19 1603  SpO2 100 % 09/23/19 1603  Vitals shown include unvalidated device data.  Last Pain:  Vitals:   09/23/19 1552  TempSrc:   PainSc: 0-No pain         Complications: No complications documented.

## 2019-09-23 NOTE — H&P (Signed)
History of Present Illness:  Kaitlin Mullen is a 54 y.o. female who presents for follow-up of her right shoulder pain secondary to impingement/tendinopathy with a possible rotator cuff tear. Since her last visit 4 months ago, she notes little change in her symptoms. She continues to have moderate pain in the shoulder which is worse with activities at or above shoulder level, and also has pain at night if she sleeps on her right side. She did receive a steroid injection at her last visit which she felt provided little if any relief of her symptoms. She denies any reinjury to the shoulder, and denies any fevers or chills. 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 Since her last visit, she has undergone an MRI scan of the right shoulder and presents today to review these results.  No current Epic-ordered outpatient medications on file.   No current Epic-ordered facility-administered medications on file.   Allergies:  Marland Kitchen Mold Unknown  . Other Unknown  Dust and mold   Past Medical History:  . Anemia  . Diverticulosis 09/17/2014  . Hyperlipidemia  . Hyperplastic colon polyp 09/17/2014  . Kidney stone  . Nephrolithiasis  . Scoliosis  . Unspecified hereditary and idiopathic peripheral neuropathy   Past Surgical History:  . CESAREAN SECTION  . COLONOSCOPY 09/17/2014  Hyperplastic colon polyp/Repeat 64yrs/MUS  . COLONOSCOPY 11/07/2017  Hyperplastic colon polyp/PHx CP/FHx CP/Repeat 50yrs/MUS  . DILATION AND CURETTAGE, DIAGNOSTIC / THERAPEUTIC  . TONSILLECTOMY 1972?  . TUBAL LIGATION   Family History:  . Fibrocystic breast disease Mother  . High blood pressure (Hypertension) Mother  . Osteoarthritis Mother  . Osteoporosis (Thinning of bones) Mother  . Colon polyps Mother  . Diabetes type II Father  . High blood pressure (Hypertension) Father  . Heart disease Father  . Endometriosis Sister  . Heart disease Paternal  Grandfather  . Diabetes type II Sister  . Stroke Maternal Grandfather  . Colon cancer Neg Hx   Social History   Socioeconomic History  . Marital status: Married  Spouse name: Not on file  . Number of children: Not on file  . Years of education: Not on file  . Highest education level: Not on file  Occupational History  . Not on file  Tobacco Use  . Smoking status: Former Smoker  Quit date: 05/07/1985  Years since quitting: 34.3  . Smokeless tobacco: Never Used  Vaping Use  . Vaping Use: Never used  Substance and Sexual Activity  . Alcohol use: Yes  Alcohol/week: 6.0 standard drinks  Types: 6 Cans of beer per week  Comment: stated that she has cut back on her glasses of wine to maybe 1 a week  . Drug use: No  . Sexual activity: Yes  Partners: Male  Birth control/protection: None  Other Topics Concern  . Not on file  Social History Narrative  . Not on file   Social Determinants of Health   Financial Resource Strain:  . Difficulty of Paying Living Expenses:  Food Insecurity:  . Worried About Programme researcher, broadcasting/film/video in the Last Year:  . Barista in the Last Year:  Transportation Needs:  . Freight forwarder (Medical):  Marland Kitchen Lack of Transportation (Non-Medical):   Review of Systems:  A comprehensive 14 point ROS was performed, reviewed, and the pertinent orthopaedic findings are documented in the HPI.  Physical Exam: Vitals:  09/01/19 1402  BP: 130/82  Weight: 75.8 kg (  167 lb)  Height: 165.6 cm (5' 5.21")  PainSc: 7  PainLoc: Shoulder   General/Constitutional: The patient appears to be well-nourished, well-developed, and in no acute distress. Neuro/Psych: Normal mood and affect, oriented to person, place and time. Eyes: Non-icteric. Pupils are equal, round, and reactive to light, and exhibit synchronous movement. ENT: Unremarkable. Lymphatic: No palpable adenopathy. Respiratory: Lungs clear to auscultation, Normal chest excursion, No wheezes and Non-labored  breathing Cardiovascular: Regular rate and rhythm. No murmurs. and No edema, swelling or tenderness, except as noted in detailed exam. Integumentary: No impressive skin lesions present, except as noted in detailed exam. Musculoskeletal: Unremarkable, except as noted in detailed exam.  Right shoulder exam: SKIN: Normal SWELLING: None WARMTH: None LYMPH NODES: No adenopathy palpable CREPITUS: None TENDERNESS: Mildly tender over anterolateral shoulder ROM (active):  Forward flexion: 160 degrees Abduction: 155 degrees Internal rotation: T12 ROM (passive):  Forward flexion: 165 degrees Abduction: 160 degrees  ER/IR at 90 abd: 95 degrees/60 degrees  She experiences mild-moderate pain with internal rotation at 90 degrees of abduction, and mild pain with forward flexion, abduction, and external rotation at 90 degrees of abduction.  STRENGTH: Forward flexion: 4-4+/5 Abduction: 4-4+/5 External rotation: 4-4+/5 Internal rotation: 4+/5 Pain with RC testing: Mild pain with resisted abduction and external rotation  STABILITY: Normal  SPECIAL TESTS: Juanetta Gosling' test: Mildly positive Speed's test: Negative  Yergason's test: Mildly positive Capsulitis - pain w/ passive ER: No Crossed arm test: Negative Crank: Not evaluated Anterior apprehension: Negative Posterior apprehension: Not evaluated  She is neurovascularly intact to the right upper extremity and hand.  X-rays/MRI/Lab data:  A recent MRI scan of the right shoulder is available for review and has been reviewed by myself. By report, the scan demonstrates evidence of "partial interstitial tears throughout the supraspinatus with bursal and articular surface fraying. No definite full-thickness tear. No retraction or atrophy." There is evidence of some degenerative signal within the superior labrum without a clear tear. The biceps tendon appears to be in good condition as well. No significant degenerative changes are identified. Both  the films and report were reviewed by myself and discussed with the patient.  Assessment: . Rotator cuff tendinitis with partial-thickness rotator cuff tear, right shoulder.  Plan: The treatment options were discussed with the patient. In addition, patient educational materials were provided regarding the diagnosis and treatment options. The patient is quite frustrated by her symptoms and functional limitations, and is ready to consider more aggressive treatment options. Therefore, I have recommended a surgical procedure, specifically a right shoulder arthroscopy with debridement, decompression, possible rotator cuff repair, and possible biceps tenodesis. The procedure was discussed with the patient, as were the potential risks (including bleeding, infection, nerve and/or blood vessel injury, persistent or recurrent pain, failure of the repair, progression of arthritis, need for further surgery, blood clots, strokes, heart attacks and/or arhythmias, pneumonia, etc.) and benefits. The patient states her understanding and wishes to proceed. All of the patient's questions and concerns were answered. She can call any time with further concerns. She will follow up post-surgery, routine.   H&P reviewed and patient re-examined. No changes.

## 2019-09-23 NOTE — Anesthesia Preprocedure Evaluation (Signed)
Anesthesia Evaluation  Patient identified by MRN, date of birth, ID band Patient awake    Reviewed: Allergy & Precautions, H&P , NPO status , Patient's Chart, lab work & pertinent test results, reviewed documented beta blocker date and time   Airway Mallampati: II  TM Distance: >3 FB Neck ROM: full    Dental  (+) Teeth Intact   Pulmonary neg pulmonary ROS, former smoker,    Pulmonary exam normal        Cardiovascular Exercise Tolerance: Good negative cardio ROS Normal cardiovascular exam Rhythm:regular Rate:Normal     Neuro/Psych negative neurological ROS  negative psych ROS   GI/Hepatic negative GI ROS, Neg liver ROS,   Endo/Other  negative endocrine ROS  Renal/GU negative Renal ROS  negative genitourinary   Musculoskeletal   Abdominal   Peds  Hematology negative hematology ROS (+)   Anesthesia Other Findings Past Medical History: No date: Family history of adverse reaction to anesthesia     Comment:  PONV MOTHER Past Surgical History: No date: ABLATION No date: BREAST CYST ASPIRATION; Right     Comment:  neg No date: CESAREAN SECTION No date: DILATION AND CURETTAGE OF UTERUS BMI    Body Mass Index: 26.71 kg/m     Reproductive/Obstetrics negative OB ROS                             Anesthesia Physical Anesthesia Plan  ASA: II  Anesthesia Plan: General ETT   Post-op Pain Management:  Regional for Post-op pain   Induction:   PONV Risk Score and Plan: 4 or greater  Airway Management Planned:   Additional Equipment:   Intra-op Plan:   Post-operative Plan:   Informed Consent: I have reviewed the patients History and Physical, chart, labs and discussed the procedure including the risks, benefits and alternatives for the proposed anesthesia with the patient or authorized representative who has indicated his/her understanding and acceptance.     Dental Advisory  Given  Plan Discussed with: CRNA  Anesthesia Plan Comments:         Anesthesia Quick Evaluation

## 2019-09-24 ENCOUNTER — Encounter: Payer: Self-pay | Admitting: Surgery

## 2019-09-26 NOTE — Anesthesia Postprocedure Evaluation (Signed)
Anesthesia Post Note  Patient: Kaitlin Mullen  Procedure(s) Performed: RIGHT SHOULDER ARTHROSCOPY WITH DEBRIDEMENT, DECOMPRESSION, POSSIBEL ROTATOR CUFF REPAIR, AND POSSIBLE BICEPS TENODESIS. (Right Shoulder)  Patient location during evaluation: PACU Anesthesia Type: General Level of consciousness: awake and alert and oriented Pain management: pain level controlled Vital Signs Assessment: post-procedure vital signs reviewed and stable Respiratory status: spontaneous breathing Cardiovascular status: blood pressure returned to baseline Anesthetic complications: no   No complications documented.   Last Vitals:  Vitals:   09/23/19 1639 09/23/19 1651  BP: 129/77 130/85  Pulse: 64 67  Resp: 16 12  Temp: (!) 36.1 C   SpO2: 98% 100%    Last Pain:  Vitals:   09/24/19 0846  TempSrc:   PainSc: 0-No pain                 Tynell Winchell

## 2021-09-19 DIAGNOSIS — E782 Mixed hyperlipidemia: Secondary | ICD-10-CM | POA: Diagnosis not present

## 2021-09-19 DIAGNOSIS — R8290 Unspecified abnormal findings in urine: Secondary | ICD-10-CM | POA: Diagnosis not present

## 2021-09-19 DIAGNOSIS — Z0000 Encounter for general adult medical examination without abnormal findings: Secondary | ICD-10-CM | POA: Diagnosis not present

## 2021-09-19 DIAGNOSIS — R300 Dysuria: Secondary | ICD-10-CM | POA: Diagnosis not present

## 2021-09-19 DIAGNOSIS — Z131 Encounter for screening for diabetes mellitus: Secondary | ICD-10-CM | POA: Diagnosis not present

## 2021-09-21 DIAGNOSIS — Z0000 Encounter for general adult medical examination without abnormal findings: Secondary | ICD-10-CM | POA: Diagnosis not present

## 2021-09-21 DIAGNOSIS — E782 Mixed hyperlipidemia: Secondary | ICD-10-CM | POA: Diagnosis not present

## 2021-09-21 DIAGNOSIS — Z1231 Encounter for screening mammogram for malignant neoplasm of breast: Secondary | ICD-10-CM | POA: Diagnosis not present

## 2021-09-27 ENCOUNTER — Other Ambulatory Visit: Payer: Self-pay | Admitting: Physician Assistant

## 2021-09-27 DIAGNOSIS — Z1231 Encounter for screening mammogram for malignant neoplasm of breast: Secondary | ICD-10-CM

## 2021-11-15 ENCOUNTER — Other Ambulatory Visit: Payer: Self-pay | Admitting: Physician Assistant

## 2021-11-15 ENCOUNTER — Other Ambulatory Visit (HOSPITAL_COMMUNITY): Payer: Self-pay | Admitting: Physician Assistant

## 2021-11-15 DIAGNOSIS — R42 Dizziness and giddiness: Secondary | ICD-10-CM

## 2021-11-21 ENCOUNTER — Ambulatory Visit
Admission: RE | Admit: 2021-11-21 | Discharge: 2021-11-21 | Disposition: A | Discharge status: Home / Self Care | Payer: 59 | Source: Ambulatory Visit | Attending: Physician Assistant | Admitting: Physician Assistant

## 2021-11-21 DIAGNOSIS — Z1231 Encounter for screening mammogram for malignant neoplasm of breast: Secondary | ICD-10-CM | POA: Diagnosis present

## 2021-11-21 DIAGNOSIS — R42 Dizziness and giddiness: Secondary | ICD-10-CM | POA: Insufficient documentation

## 2021-11-21 MED ORDER — GADOBUTROL 1 MMOL/ML IV SOLN
7.0000 mL | Freq: Once | INTRAVENOUS | Status: AC | PRN
  Administered 2021-11-21: 7 mL via INTRAVENOUS

## 2022-10-10 ENCOUNTER — Other Ambulatory Visit: Payer: Self-pay | Admitting: Physician Assistant

## 2022-10-10 DIAGNOSIS — Z1231 Encounter for screening mammogram for malignant neoplasm of breast: Secondary | ICD-10-CM

## 2022-12-04 ENCOUNTER — Ambulatory Visit
Admission: RE | Admit: 2022-12-04 | Discharge: 2022-12-04 | Disposition: A | Discharge status: Home / Self Care | Payer: No Typology Code available for payment source | Source: Ambulatory Visit | Attending: Physician Assistant | Admitting: Physician Assistant

## 2022-12-04 DIAGNOSIS — Z1231 Encounter for screening mammogram for malignant neoplasm of breast: Secondary | ICD-10-CM | POA: Insufficient documentation

## 2023-04-19 ENCOUNTER — Encounter: Payer: Self-pay | Admitting: Internal Medicine

## 2023-04-24 ENCOUNTER — Encounter: Payer: Self-pay | Admitting: Internal Medicine

## 2023-05-01 ENCOUNTER — Encounter
Admission: RE | Disposition: A | Discharge status: Home / Self Care | Payer: Self-pay | Source: Home / Self Care | Attending: Internal Medicine

## 2023-05-01 ENCOUNTER — Ambulatory Visit: Admitting: Anesthesiology

## 2023-05-01 ENCOUNTER — Ambulatory Visit
Admission: RE | Admit: 2023-05-01 | Discharge: 2023-05-01 | Disposition: A | Discharge status: Home / Self Care | Payer: BC Managed Care – PPO | Attending: Internal Medicine | Admitting: Internal Medicine

## 2023-05-01 ENCOUNTER — Encounter: Payer: Self-pay | Admitting: Internal Medicine

## 2023-05-01 ENCOUNTER — Other Ambulatory Visit: Payer: Self-pay

## 2023-05-01 DIAGNOSIS — M545 Low back pain, unspecified: Secondary | ICD-10-CM | POA: Diagnosis not present

## 2023-05-01 DIAGNOSIS — K640 First degree hemorrhoids: Secondary | ICD-10-CM | POA: Diagnosis not present

## 2023-05-01 DIAGNOSIS — G8929 Other chronic pain: Secondary | ICD-10-CM | POA: Diagnosis not present

## 2023-05-01 DIAGNOSIS — Z1211 Encounter for screening for malignant neoplasm of colon: Secondary | ICD-10-CM | POA: Diagnosis present

## 2023-05-01 DIAGNOSIS — Z83719 Family history of colon polyps, unspecified: Secondary | ICD-10-CM | POA: Diagnosis not present

## 2023-05-01 DIAGNOSIS — Z87891 Personal history of nicotine dependence: Secondary | ICD-10-CM | POA: Insufficient documentation

## 2023-05-01 DIAGNOSIS — D120 Benign neoplasm of cecum: Secondary | ICD-10-CM | POA: Diagnosis not present

## 2023-05-01 DIAGNOSIS — K5730 Diverticulosis of large intestine without perforation or abscess without bleeding: Secondary | ICD-10-CM | POA: Diagnosis not present

## 2023-05-01 HISTORY — DX: Personal history of urinary calculi: Z87.442

## 2023-05-01 HISTORY — DX: Superior glenoid labrum lesion of right shoulder, initial encounter: S43.431A

## 2023-05-01 HISTORY — PX: COLONOSCOPY WITH PROPOFOL: SHX5780

## 2023-05-01 HISTORY — DX: Anemia, unspecified: D64.9

## 2023-05-01 HISTORY — PX: POLYPECTOMY: SHX5525

## 2023-05-01 HISTORY — DX: Bicipital tendinitis, right shoulder: M75.21

## 2023-05-01 HISTORY — DX: Hyperlipidemia, unspecified: E78.5

## 2023-05-01 HISTORY — DX: Calculus of kidney: N20.0

## 2023-05-01 SURGERY — COLONOSCOPY WITH PROPOFOL
Anesthesia: General

## 2023-05-01 MED ORDER — PROPOFOL 10 MG/ML IV BOLUS
INTRAVENOUS | Status: DC | PRN
  Administered 2023-05-01: 30 mg via INTRAVENOUS
  Administered 2023-05-01: 70 mg via INTRAVENOUS

## 2023-05-01 MED ORDER — LIDOCAINE 2% (20 MG/ML) 5 ML SYRINGE
INTRAMUSCULAR | Status: DC | PRN
  Administered 2023-05-01: 20 mg via INTRAVENOUS

## 2023-05-01 MED ORDER — MIDAZOLAM HCL 5 MG/5ML IJ SOLN
INTRAMUSCULAR | Status: DC | PRN
  Administered 2023-05-01: 2 mg via INTRAVENOUS

## 2023-05-01 MED ORDER — PROPOFOL 500 MG/50ML IV EMUL
INTRAVENOUS | Status: DC | PRN
  Administered 2023-05-01: 120 ug/kg/min via INTRAVENOUS

## 2023-05-01 MED ORDER — SODIUM CHLORIDE 0.9 % IV SOLN: INTRAVENOUS | Status: DC

## 2023-05-01 MED ORDER — PROPOFOL 10 MG/ML IV BOLUS
INTRAVENOUS | Status: AC
  Filled 2023-05-01: qty 20

## 2023-05-01 MED ORDER — MIDAZOLAM HCL 2 MG/2ML IJ SOLN
INTRAMUSCULAR | Status: AC
  Filled 2023-05-01: qty 2

## 2023-05-01 NOTE — Anesthesia Preprocedure Evaluation (Addendum)
 Anesthesia Evaluation  Patient identified by MRN, date of birth, ID band Patient awake    Reviewed: Allergy & Precautions, NPO status , Patient's Chart, lab work & pertinent test results  History of Anesthesia Complications Negative for: history of anesthetic complications  Airway Mallampati: I   Neck ROM: Full    Dental no notable dental hx.    Pulmonary former smoker (quit 1987)   Pulmonary exam normal breath sounds clear to auscultation       Cardiovascular Exercise Tolerance: Good negative cardio ROS Normal cardiovascular exam Rhythm:Regular Rate:Normal     Neuro/Psych Chronic lower back pain    GI/Hepatic negative GI ROS,,,  Endo/Other  negative endocrine ROS    Renal/GU Renal disease (nephrolithiasis)     Musculoskeletal   Abdominal   Peds  Hematology  (+) Blood dyscrasia, anemia   Anesthesia Other Findings   Reproductive/Obstetrics                             Anesthesia Physical Anesthesia Plan  ASA: 2  Anesthesia Plan: General   Post-op Pain Management:    Induction: Intravenous  PONV Risk Score and Plan: 3 and Propofol infusion, TIVA and Treatment may vary due to age or medical condition  Airway Management Planned: Natural Airway  Additional Equipment:   Intra-op Plan:   Post-operative Plan:   Informed Consent: I have reviewed the patients History and Physical, chart, labs and discussed the procedure including the risks, benefits and alternatives for the proposed anesthesia with the patient or authorized representative who has indicated his/her understanding and acceptance.       Plan Discussed with: CRNA  Anesthesia Plan Comments: (LMA/GETA backup discussed.  Patient consented for risks of anesthesia including but not limited to:  - adverse reactions to medications - damage to eyes, teeth, lips or other oral mucosa - nerve damage due to positioning  -  sore throat or hoarseness - damage to heart, brain, nerves, lungs, other parts of body or loss of life  Informed patient about role of CRNA in peri- and intra-operative care.  Patient voiced understanding.)       Anesthesia Quick Evaluation

## 2023-05-01 NOTE — H&P (Signed)
 Outpatient short stay form Pre-procedure 05/01/2023 12:25 PM Kaitlin Mullen K. Norma Fredrickson, M.D.  Primary Physician: Kaitlin Mullen, M.D.  Reason for visit:  Family history of colon polyps  History of present illness:   58 year old patient presenting for family history of colon polyps. Patient denies any change in bowel habits, rectal bleeding or involuntary weight loss.      Current Facility-Administered Medications:    0.9 %  sodium chloride infusion, , Intravenous, Continuous, [place], Boykin Nearing, MD, Last Rate: 20 mL/hr at 05/01/23 1135, New Bag at 05/01/23 1135  Medications Prior to Admission  Medication Sig Dispense Refill Last Dose/Taking   estradiol (ESTRACE) 0.1 MG/GM vaginal cream Place 1 Applicatorful vaginally at bedtime.   Taking   estrogen, conjugated,-medroxyprogesterone (PREMPRO) 0.3-1.5 MG tablet Take 1 tablet by mouth daily.   04/29/2023   acidophilus (RISAQUAD) CAPS capsule Take 1 capsule by mouth daily.      ibuprofen (ADVIL) 200 MG tablet Take 400 mg by mouth every 6 (six) hours as needed for moderate pain.      oxyCODONE (ROXICODONE) 5 MG immediate release tablet Take 1-2 tablets (5-10 mg total) by mouth every 4 (four) hours as needed for moderate pain or severe pain. 40 tablet 0      Allergies  Allergen Reactions   Molds & Smuts Other (See Comments)     Past Medical History:  Diagnosis Date   Anemia    Family history of adverse reaction to anesthesia    PONV MOTHER   History of kidney stones    Hyperlipidemia    Nephrolithiasis    Tendinitis of upper biceps tendon of right shoulder    Type 1 superior labral anterior-to-posterior (SLAP) tear of right shoulder     Review of systems:  Otherwise negative.    Physical Exam  Gen: Alert, oriented. Appears stated age.  HEENT: [employer]/AT. PERRLA. Lungs: CTA, no wheezes. CV: RR nl S1, S2. Abd: soft, benign, no masses. BS+ Ext: No edema. Pulses 2+    Planned procedures: Proceed with colonoscopy. The patient  understands the nature of the planned procedure, indications, risks, alternatives and potential complications including but not limited to bleeding, infection, perforation, damage to internal organs and possible oversedation/side effects from anesthesia. The patient agrees and gives consent to proceed.  Please refer to procedure notes for findings, recommendations and patient disposition/instructions.     Kaitlin Mullen K. Norma Fredrickson, M.D. Gastroenterology 05/01/2023  12:25 PM

## 2023-05-01 NOTE — Anesthesia Postprocedure Evaluation (Signed)
 Anesthesia Post Note  Patient: Kaitlin Mullen  Procedure(s) Performed: COLONOSCOPY WITH PROPOFOL POLYPECTOMY  Patient location during evaluation: PACU Anesthesia Type: General Level of consciousness: awake and alert, oriented and patient cooperative Pain management: pain level controlled Vital Signs Assessment: post-procedure vital signs reviewed and stable Respiratory status: spontaneous breathing, nonlabored ventilation and respiratory function stable Cardiovascular status: blood pressure returned to baseline and stable Postop Assessment: adequate PO intake Anesthetic complications: no   No notable events documented.   Last Vitals:  Vitals:   05/01/23 1320 05/01/23 1326  BP: 129/75 130/78  Pulse: (!) 58 62  Resp: 14 19  Temp:    SpO2: 100% 100%    Last Pain:  Vitals:   05/01/23 1320  TempSrc:   PainSc: 0-No pain                 Reed Breech

## 2023-05-01 NOTE — Interval H&P Note (Signed)
 History and Physical Interval Note:  05/01/2023 12:35 PM  Kaitlin Mullen  has presented today for surgery, with the diagnosis of Z12.11 (ICD-10-CM) - Colon cancer screening Z83.719 (ICD-10-CM) - Family hx colonic polyps.  The various methods of treatment have been discussed with the patient and family. After consideration of risks, benefits and other options for treatment, the patient has consented to  Procedure(s): COLONOSCOPY WITH PROPOFOL (N/A) as a surgical intervention.  The patient's history has been reviewed, patient examined, no change in status, stable for surgery.  I have reviewed the patient's chart and labs.  Questions were answered to the patient's satisfaction.     Enigma, [place]

## 2023-05-01 NOTE — Transfer of Care (Signed)
 Immediate Anesthesia Transfer of Care Note  Patient: Kaitlin Mullen  Procedure(s) Performed: COLONOSCOPY WITH PROPOFOL POLYPECTOMY  Patient Location: Endoscopy Unit  Anesthesia Type:General  Level of Consciousness: awake, alert , and oriented  Airway & Oxygen Therapy: Patient Spontanous Breathing  Post-op Assessment: Report given to RN and Post -op Vital signs reviewed and stable  Post vital signs: Reviewed  Last Vitals:  Vitals Value Taken Time  BP    Temp    Pulse    Resp    SpO2      Last Pain:  Vitals:   05/01/23 1132  TempSrc: Temporal  PainSc: 0-No pain         Complications: No notable events documented.

## 2023-05-01 NOTE — Op Note (Signed)
 [facility] Gastroenterology Patient Name: Kaitlin Mullen Procedure Date: 05/01/2023 12:47 PM MRN: [id number] Account #: [id number] Date of Birth: 1965/04/03 Admit Type: Outpatient Age: 58 Room: [facility] ENDO ROOM 1 Gender: Female Note Status: Finalized Instrument Name: Prentice Docker [id number] Procedure:             Colonoscopy Indications:           Colon cancer screening in patient at increased risk:                         Family history of 1st-degree relative with colon polyps Providers:             Boykin Nearing. Marquett Bertoli MD, MD Medicines:             Propofol per Anesthesia Complications:         No immediate complications. Estimated blood loss: None. Procedure:             Pre-Anesthesia Assessment:                        - The risks and benefits of the procedure and the                         sedation options and risks were discussed with the                         patient. All questions were answered and informed                         consent was obtained.                        - Patient identification and proposed procedure were                         verified prior to the procedure by the nurse. The                         procedure was verified in the procedure room.                        - ASA Grade Assessment: III - A patient with severe                         systemic disease.                        - After reviewing the risks and benefits, the patient                         was deemed in satisfactory condition to undergo the                         procedure.                        After obtaining informed consent, the colonoscope was                         passed under direct vision. Throughout the procedure,  the patient's blood pressure, pulse, and oxygen                         saturations were monitored continuously. The                         Colonoscope was introduced through the anus and                         advanced  to the the cecum, identified by appendiceal                         orifice and ileocecal valve. The colonoscopy was                         performed without difficulty. The patient tolerated                         the procedure well. The quality of the bowel                         preparation was good. The ileocecal valve, appendiceal                         orifice, and rectum were photographed. Findings:      The perianal and digital rectal examinations were normal. Pertinent       negatives include normal sphincter tone and no palpable rectal lesions.      Non-bleeding internal hemorrhoids were found during retroflexion. The       hemorrhoids were Grade I (internal hemorrhoids that do not prolapse).      Many small-mouthed diverticula were found in the sigmoid colon.      A single large-mouthed diverticulum was found in the ascending colon.      A 14 mm polyp was found in the cecum. The polyp was sessile. The polyp       was removed with a hot snare. Resection and retrieval were complete.       Estimated blood loss: none.      The exam was otherwise without abnormality. Impression:            - Non-bleeding internal hemorrhoids.                        - Diverticulosis in the sigmoid colon.                        - Diverticulosis in the ascending colon.                        - One 14 mm polyp in the cecum, removed with a hot                         snare. Resected and retrieved.                        - The examination was otherwise normal. Recommendation:        - Patient has a contact number available for  emergencies. The signs and symptoms of potential                         delayed complications were discussed with the patient.                         Return to normal activities tomorrow. Written                         discharge instructions were provided to the patient.                        - Resume previous diet.                        - Continue  present medications.                        - Repeat colonoscopy is recommended for surveillance.                         The colonoscopy date will be determined after                         pathology results from today's exam become available                         for review.                        - Return to GI office PRN.                        - The findings and recommendations were discussed with                         the patient. Procedure Code(s):     --- Professional ---                        [phone], Colonoscopy, flexible; with removal of                         tumor(s), polyp(s), or other lesion(s) by snare                         technique Diagnosis Code(s):     --- Professional ---                        K57.30, Diverticulosis of large intestine without                         perforation or abscess without bleeding                        D12.0, Benign neoplasm of cecum                        K64.0, First degree hemorrhoids                        Z83.71, Family history  of colonic polyps CPT copyright 2022 American [employer]. All rights reserved. The codes documented in this report are preliminary and upon coder review may  be revised to meet current compliance requirements. Stanton Kidney MD, MD 05/01/2023 1:11:32 PM This report has been signed electronically. Number of Addenda: 0 Note Initiated On: 05/01/2023 12:47 PM Scope Withdrawal Time: 0 hours 6 minutes 32 seconds  Total Procedure Duration: 0 hours 11 minutes 22 seconds  Estimated Blood Loss:  Estimated blood loss: none.      [facility]

## 2023-05-02 ENCOUNTER — Encounter: Payer: Self-pay | Admitting: Internal Medicine

## 2023-05-02 LAB — SURGICAL PATHOLOGY

## 2023-06-28 ENCOUNTER — Encounter: Payer: Self-pay | Admitting: Internal Medicine

## 2023-11-06 ENCOUNTER — Other Ambulatory Visit: Payer: Self-pay | Admitting: Physician Assistant

## 2023-11-06 DIAGNOSIS — Z1231 Encounter for screening mammogram for malignant neoplasm of breast: Secondary | ICD-10-CM

## 2023-12-05 ENCOUNTER — Ambulatory Visit
Admission: RE | Admit: 2023-12-05 | Discharge: 2023-12-05 | Disposition: A | Discharge status: Home / Self Care | Source: Ambulatory Visit | Attending: Physician Assistant | Admitting: Physician Assistant

## 2023-12-05 DIAGNOSIS — Z1231 Encounter for screening mammogram for malignant neoplasm of breast: Secondary | ICD-10-CM | POA: Insufficient documentation
